# Patient Record
Sex: Female | Born: 1985 | Race: Black or African American | Hispanic: No | Marital: Single | Smoking: Never smoker
Health system: Southern US, Community
[De-identification: ages and names within clinical notes are randomized; demographics above are authoritative.]

## PROBLEM LIST (undated history)

## (undated) ENCOUNTER — Ambulatory Visit (HOSPITAL_COMMUNITY): Admission: EM | Payer: BC Managed Care – PPO | Source: Home / Self Care

## (undated) DIAGNOSIS — J45909 Unspecified asthma, uncomplicated: Secondary | ICD-10-CM

## (undated) DIAGNOSIS — L309 Dermatitis, unspecified: Secondary | ICD-10-CM

## (undated) HISTORY — DX: Dermatitis, unspecified: L30.9

## (undated) HISTORY — PX: CYST EXCISION: SHX5701

## (undated) HISTORY — DX: Unspecified asthma, uncomplicated: J45.909

---

## 2014-02-22 ENCOUNTER — Other Ambulatory Visit: Payer: Self-pay | Admitting: Obstetrics

## 2014-02-22 DIAGNOSIS — N63 Unspecified lump in unspecified breast: Secondary | ICD-10-CM

## 2014-03-10 ENCOUNTER — Other Ambulatory Visit: Payer: Self-pay | Admitting: Obstetrics

## 2014-03-10 ENCOUNTER — Ambulatory Visit
Admission: RE | Admit: 2014-03-10 | Discharge: 2014-03-10 | Disposition: A | Discharge status: Home / Self Care | Payer: BC Managed Care – PPO | Source: Ambulatory Visit | Attending: Obstetrics | Admitting: Obstetrics

## 2014-03-10 ENCOUNTER — Ambulatory Visit: Admission: RE | Admit: 2014-03-10 | Payer: BC Managed Care – PPO | Source: Ambulatory Visit

## 2014-03-10 DIAGNOSIS — N63 Unspecified lump in unspecified breast: Secondary | ICD-10-CM

## 2015-08-30 ENCOUNTER — Ambulatory Visit (INDEPENDENT_AMBULATORY_CARE_PROVIDER_SITE_OTHER): Payer: BC Managed Care – PPO | Admitting: Pediatrics

## 2015-08-30 ENCOUNTER — Encounter: Payer: Self-pay | Admitting: Pediatrics

## 2015-08-30 VITALS — BP 110/72 | HR 76 | Temp 98.6°F | Resp 16 | Ht 67.13 in | Wt 128.7 lb

## 2015-08-30 DIAGNOSIS — J301 Allergic rhinitis due to pollen: Secondary | ICD-10-CM

## 2015-08-30 DIAGNOSIS — J4530 Mild persistent asthma, uncomplicated: Secondary | ICD-10-CM | POA: Diagnosis not present

## 2015-08-30 DIAGNOSIS — H1045 Other chronic allergic conjunctivitis: Secondary | ICD-10-CM

## 2015-08-30 DIAGNOSIS — H1010 Acute atopic conjunctivitis, unspecified eye: Secondary | ICD-10-CM

## 2015-08-30 MED ORDER — FLUTICASONE PROPIONATE 50 MCG/ACT NA SUSP: 2.0000 | Freq: Every day | NASAL | Status: AC

## 2015-08-30 MED ORDER — OLOPATADINE HCL 0.2 % OP SOLN: 1.0000 [drp] | OPHTHALMIC | Status: DC

## 2015-08-30 MED ORDER — TRIAMCINOLONE ACETONIDE 0.1 % EX CREA: 1.0000 "application " | TOPICAL_CREAM | Freq: Two times a day (BID) | CUTANEOUS | Status: DC | PRN

## 2015-08-30 MED ORDER — MONTELUKAST SODIUM 10 MG PO TABS: 10.0000 mg | ORAL_TABLET | Freq: Every day | ORAL | Status: DC

## 2015-08-30 MED ORDER — ALBUTEROL SULFATE HFA 108 (90 BASE) MCG/ACT IN AERS: 2.0000 | INHALATION_SPRAY | RESPIRATORY_TRACT | Status: DC | PRN

## 2015-08-30 NOTE — Patient Instructions (Addendum)
Environmental control of dust mite Zyrtec 10 mg once a day for runny nose or itchy eyes Fluticasone 2 sprays per nostril once a day for stuffy nose Montelukast  10 mg once a day for coughing or wheezing Pro-air 2 puffs every 4 hours if needed for wheezing or coughing spells Pataday 1 drop in each eye 10 minutes before putting on contact lenses Add prednisone 10 mg twice a day for 4 days, 10 mg on the fifth day to bring your allergic symptoms under control  If you are not wearing contact lenses, you may use Zaditor 0.025%-one drop 3 times a day to prevent allergies in the eyes and Opcon-A one drop 3 times a day if needed for itchy eyes  Triamcinolone 0.1% cream twice a day if needed to red itchy areas below the face.

## 2015-08-30 NOTE — Progress Notes (Signed)
[address] [place] [address] Dept: [phone]  New Patient Note  Patient ID: Donna GullyShantae Joseph, female    DOB: May 11, 1985  Age: 30 y.o. MRN: [id number] Date of Office Visit: 08/30/2015 Referring provider: No referring provider defined for this encounter.    Chief Complaint: Allergies and Asthma  HPI Donna Joseph presents for evaluation of a runny nose , stuffy nose and itchy watery eyes for about 15 years. Her symptoms are perennial. She has aggravation of her symptoms on exposure to dust, cigarette smoke, cats, dogs and the springtime of the year. She had an episode of bronchitis one year ago and  developed asthma. She has a history of eczema. She has never had asthmatic symptoms prior to the past year. Sometimes she has shortness of breath with exercise.  Review of Systems  Constitutional: Negative.   HENT:       Nasal congestion for several years  Eyes:       Itchy eyes  Respiratory:       Bronchitis one year ago with subsequent diagnosis of asthma  Cardiovascular: Negative.   Gastrointestinal: Negative.   Genitourinary:       Ovarian cyst removed  Musculoskeletal: Negative.   Skin:       History of eczema  Neurological: Negative.   Endo/Heme/Allergies:       Sneezing from cats and dogs. No diabetes or thyroid disease  Psychiatric/Behavioral: Negative.     Outpatient Encounter Prescriptions as of 08/30/2015  Medication Sig  . albuterol (PROAIR HFA) 108 (90 Base) MCG/ACT inhaler Inhale 2 puffs into the lungs every 4 (four) hours as needed for wheezing or shortness of breath.  . fluticasone (FLONASE) 50 MCG/ACT nasal spray Place 2 sprays into both nostrils daily. For stuffy nose.  . montelukast (SINGULAIR) 10 MG tablet Take 1 tablet (10 mg total) by mouth at bedtime. For coughing and wheezing.  . Olopatadine HCl (PATADAY) 0.2 % SOLN Place 1 drop into both eyes 1 day or 1 dose.  . triamcinolone cream (KENALOG) 0.1 % Apply 1 application topically 2 (two) times  daily as needed. To red itchy areas below the face.   No facility-administered encounter medications on file as of 08/30/2015.     Drug Allergies:  No Known Allergies  Family History: Donna Joseph's family history includes Asthma in her brother; Sinusitis in her mother. There is no history of Allergic rhinitis, Angioedema, Eczema, Immunodeficiency, or Urticaria..  Social and environmental. 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 She does not smoke cigarettes. She is not exposed to cigarette smoking. There are no pets in the home.  Physical Exam: BP 110/72 mmHg  Pulse 76  Temp(Src) 98.6 F (37 C) (Oral)  Resp 16  Ht 5' 7.13" (1.705 m)  Wt 128 lb 12 oz (58.4 kg)  BMI 20.09 kg/m2   Physical Exam  Constitutional: She is oriented to person, place, and time. She appears well-developed and well-nourished.  HENT:  Eyes showed mild erythema of the palpebral conjunctiva. Ears normal. Nose moderate swelling of nasal turbinates with clear nasal discharge. Pharynx normal.  Neck: Neck supple. No thyromegaly present.  Cardiovascular:  S1 and S2 normal no murmurs  Pulmonary/Chest:  Clear to percussion and auscultation  Abdominal: Soft. There is no tenderness (no hepatosplenomegaly).  Lymphadenopathy:    She has no cervical adenopathy.  Neurological: She is alert and oriented to person, place, and time.  Skin:  Clear  Psychiatric: She has a normal mood and affect. Her behavior is  normal. Judgment and thought content normal.  Vitals reviewed.   Diagnostics: FVC 3.33 L FEV1 2.96 L. Predicted FVC 3.53 L predicted FEV1 2.97 L. After albuterol 2 puffs FVC 3.50 L FEV1 3.12 L-the spirometry is in the normal range and there was no significant improvement after albuterol  Allergy skin tests were extremely positive to grass pollens, weeds, tree pollens, dust mite, cat, dog and cockroach.   Assessment Assessment and Plan: 1. Mild persistent asthma, uncomplicated   2. Allergic rhinitis due to  pollen   3. Seasonal allergic conjunctivitis     Meds ordered this encounter  Medications  . fluticasone (FLONASE) 50 MCG/ACT nasal spray    Sig: Place 2 sprays into both nostrils daily. For stuffy nose.    Dispense:  16 g    Refill:  5  . montelukast (SINGULAIR) 10 MG tablet    Sig: Take 1 tablet (10 mg total) by mouth at bedtime. For coughing and wheezing.    Dispense:  30 tablet    Refill:  5  . albuterol (PROAIR HFA) 108 (90 Base) MCG/ACT inhaler    Sig: Inhale 2 puffs into the lungs every 4 (four) hours as needed for wheezing or shortness of breath.    Dispense:  1 Inhaler    Refill:  3  . Olopatadine HCl (PATADAY) 0.2 % SOLN    Sig: Place 1 drop into both eyes 1 day or 1 dose.    Dispense:  1 Bottle    Refill:  5  . triamcinolone cream (KENALOG) 0.1 %    Sig: Apply 1 application topically 2 (two) times daily as needed. To red itchy areas below the face.    Dispense:  45 g    Refill:  3    Patient Instructions  Environmental control of dust mite Zyrtec 10 mg once a day for runny nose or itchy eyes Fluticasone 2 sprays per nostril once a day for stuffy nose Montelukast  10 mg once a day for coughing or wheezing Pro-air 2 puffs every 4 hours if needed for wheezing or coughing spells Pataday 1 drop in each eye 10 minutes before putting on contact lenses Add prednisone 10 mg twice a day for 4 days, 10 mg on the fifth day to bring your allergic symptoms under control  If you are not wearing contact lenses, you may use Zaditor 0.025%-one drop 3 times a day to prevent allergies in the eyes and Opcon-A one drop 3 times a day if needed for itchy eyes  Triamcinolone 0.1% cream twice a day if needed to red itchy areas below the face.    Return in about 4 weeks (around 09/27/2015).   Thank you for the opportunity to care for this patient.  Please do not hesitate to contact me with questions.  Tonette Bihari, M.D.  [facility] [address] [place], [place] [address] [phone]

## 2015-09-22 ENCOUNTER — Ambulatory Visit: Payer: BC Managed Care – PPO | Admitting: Pediatrics

## 2015-10-11 ENCOUNTER — Encounter: Payer: Self-pay | Admitting: Pediatrics

## 2015-10-11 ENCOUNTER — Ambulatory Visit (INDEPENDENT_AMBULATORY_CARE_PROVIDER_SITE_OTHER): Payer: BC Managed Care – PPO | Admitting: Pediatrics

## 2015-10-11 VITALS — BP 118/62 | HR 88 | Temp 99.1°F | Resp 16

## 2015-10-11 DIAGNOSIS — J301 Allergic rhinitis due to pollen: Secondary | ICD-10-CM

## 2015-10-11 DIAGNOSIS — H1045 Other chronic allergic conjunctivitis: Secondary | ICD-10-CM

## 2015-10-11 DIAGNOSIS — J4530 Mild persistent asthma, uncomplicated: Secondary | ICD-10-CM

## 2015-10-11 DIAGNOSIS — H1010 Acute atopic conjunctivitis, unspecified eye: Secondary | ICD-10-CM

## 2015-10-11 MED ORDER — AZELASTINE HCL 0.1 % NA SOLN: 2.0000 | Freq: Two times a day (BID) | NASAL | Status: AC

## 2015-10-11 NOTE — Progress Notes (Signed)
  [address] [place] [address] Dept: [phone]  FOLLOW UP NOTE  Patient ID: Donna GullyShantae Joseph, female    DOB: Jan 30, 1986  Age: 30 y.o. MRN: [id number] Date of Office Visit: 10/11/2015  Assessment Chief Complaint: Allergies and Asthma  HPI Donna Joseph presents for follow-up of asthma and allergic rhinitis. Her asthmatic symptoms are well controlled. She is having at times nasal congestion and sometimes she has some itching in the back of her throat but not all the time. She is a very allergic individual.  Current medications are cetirizine 10 mg once a day, fluticasone 2 sprays per nostril once a day, montelukast 10 mg once a day and Pataday 1 drop once a day 10 minutes before using her contact lenses, Pro-air 2 puffs every 4 hours if needed and triamcinolone 0.1% cream twice a day if needed to red itchy areas below the face   Drug Allergies:  No Known Allergies  Physical Exam: BP 118/62 mmHg  Pulse 88  Temp(Src) 99.1 F (37.3 C) (Oral)  Resp 16   Physical Exam  Constitutional: She is oriented to person, place, and time. She appears well-developed and well-nourished.  HENT:  Eyes normal. Ears normal. Nose mild swelling of nasal turbinates. Pharynx normal.  Neck: Neck supple. No thyromegaly present.  Cardiovascular:  S1 and S2 normal no murmurs  Pulmonary/Chest:  Clear to percussion and auscultation  Lymphadenopathy:    She has no cervical adenopathy.  Neurological: She is alert and oriented to person, place, and time.  Psychiatric: She has a normal mood and affect. Her behavior is normal. Judgment and thought content normal.  Vitals reviewed.   Diagnostics:  FVC 3.25 L FEV1 2.80 L. Predicted FVC 3.53 L predicted FEV1 2.97 L-the spirometry is in the normal range  Assessment and Plan: 1. Mild persistent asthma, uncomplicated   2. Allergic rhinitis due to pollen   3. Seasonal allergic conjunctivitis     Meds ordered this encounter  Medications  .  azelastine (ASTELIN) 0.1 % nasal spray    Sig: Place 2 sprays into both nostrils 2 (two) times daily.    Dispense:  30 mL    Refill:  5    Patient Instructions  Continue on your current medications If you have an itchy throat add  azelastine 0.1%-2 sprays per nostril twice a day Call me if you're not doing well on this treatment plan You are a  very allergic individual area Let me know if you want to start allergy injections    Return if symptoms worsen or fail to improve.    Thank you for the opportunity to care for this patient.  Please do not hesitate to contact me with questions.  Tonette BihariJ. A. Bardelas, M.D.  [facility] [address] [place] [address] [phone]

## 2015-10-11 NOTE — Patient Instructions (Addendum)
Continue on your current medications If you have an itchy throat add  azelastine 0.1%-2 sprays per nostril twice a day Call me if you're not doing well on this treatment plan You are a  very allergic individual area Let me know if you want to start allergy injections

## 2020-03-04 ENCOUNTER — Other Ambulatory Visit: Payer: Self-pay | Admitting: Obstetrics

## 2020-03-04 DIAGNOSIS — N6320 Unspecified lump in the left breast, unspecified quadrant: Secondary | ICD-10-CM

## 2020-03-04 DIAGNOSIS — N6310 Unspecified lump in the right breast, unspecified quadrant: Secondary | ICD-10-CM

## 2020-04-12 ENCOUNTER — Other Ambulatory Visit: Payer: BC Managed Care – PPO

## 2020-05-18 ENCOUNTER — Ambulatory Visit
Admission: RE | Admit: 2020-05-18 | Discharge: 2020-05-18 | Disposition: A | Discharge status: Home / Self Care | Payer: Medicaid Other | Source: Ambulatory Visit | Attending: Obstetrics | Admitting: Obstetrics

## 2020-05-18 ENCOUNTER — Other Ambulatory Visit: Payer: Self-pay

## 2020-05-18 DIAGNOSIS — N6310 Unspecified lump in the right breast, unspecified quadrant: Secondary | ICD-10-CM

## 2020-05-18 DIAGNOSIS — N6320 Unspecified lump in the left breast, unspecified quadrant: Secondary | ICD-10-CM

## 2021-08-13 IMAGING — US US BREAST*R* LIMITED INC AXILLA
1 series · 6 of 6 positions shown · non-contrast
Comparison: Previous exam(s).

CLINICAL DATA: Patient presents with bilateral palpable breast
lumps.



[Series 1: us breast*right* limited inc axilla · 0.07mm/px · 6 of 6 slices shown]
[im 1/6]
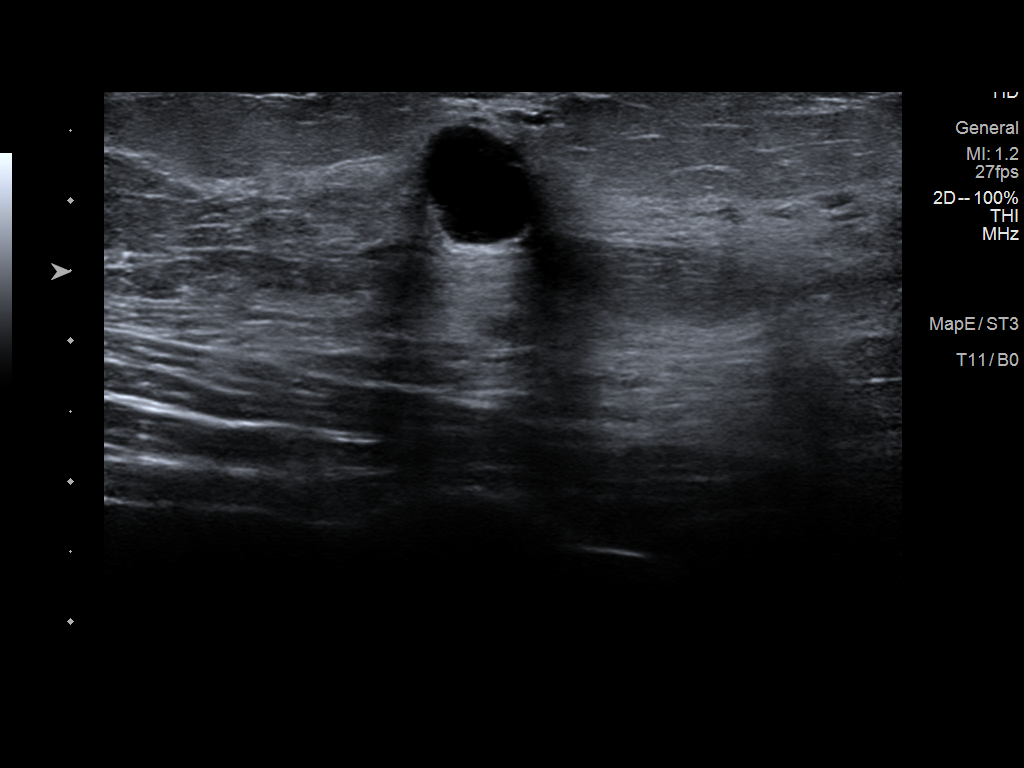
[im 2/6]
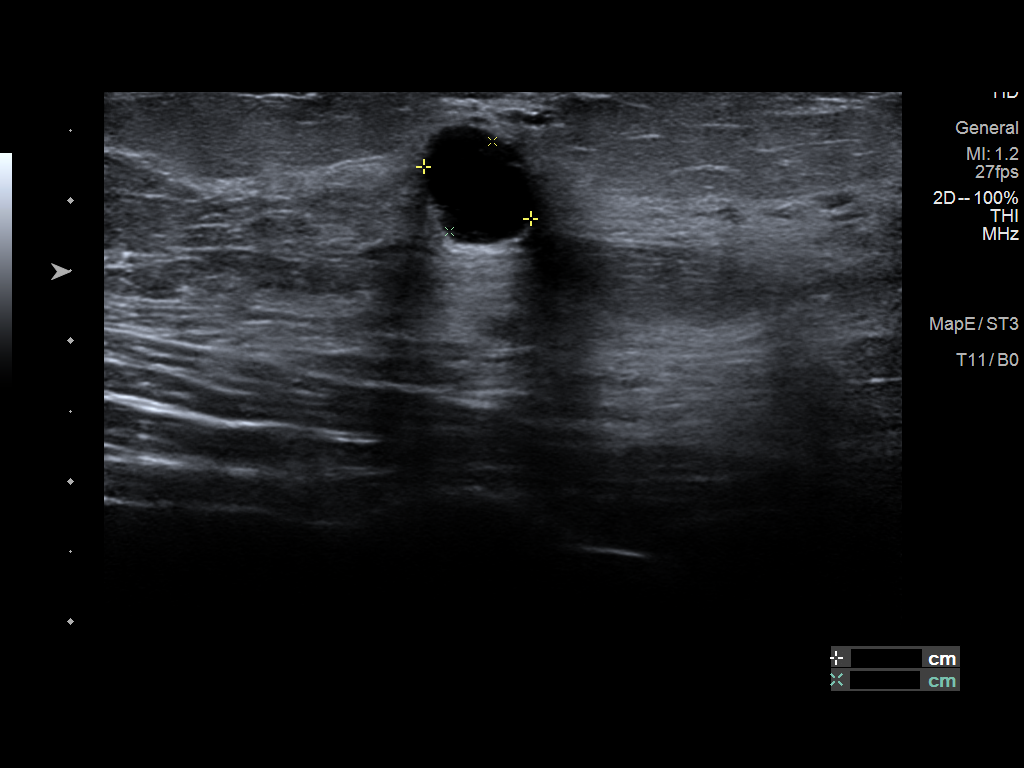
[im 3/6]
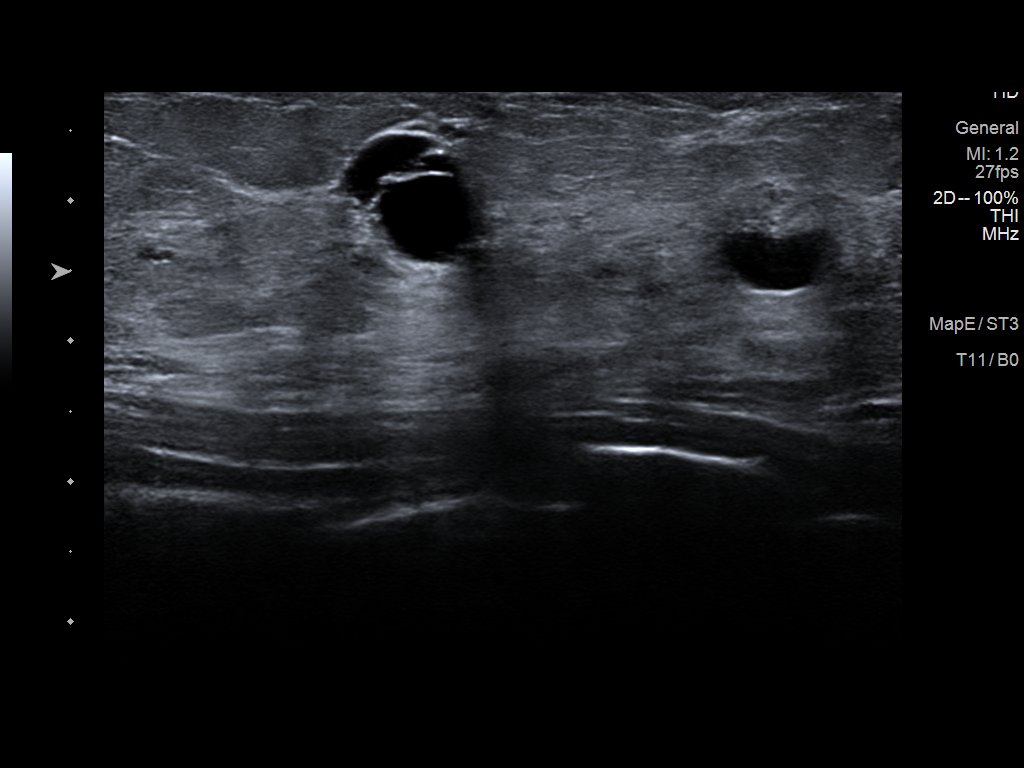
[im 4/6]
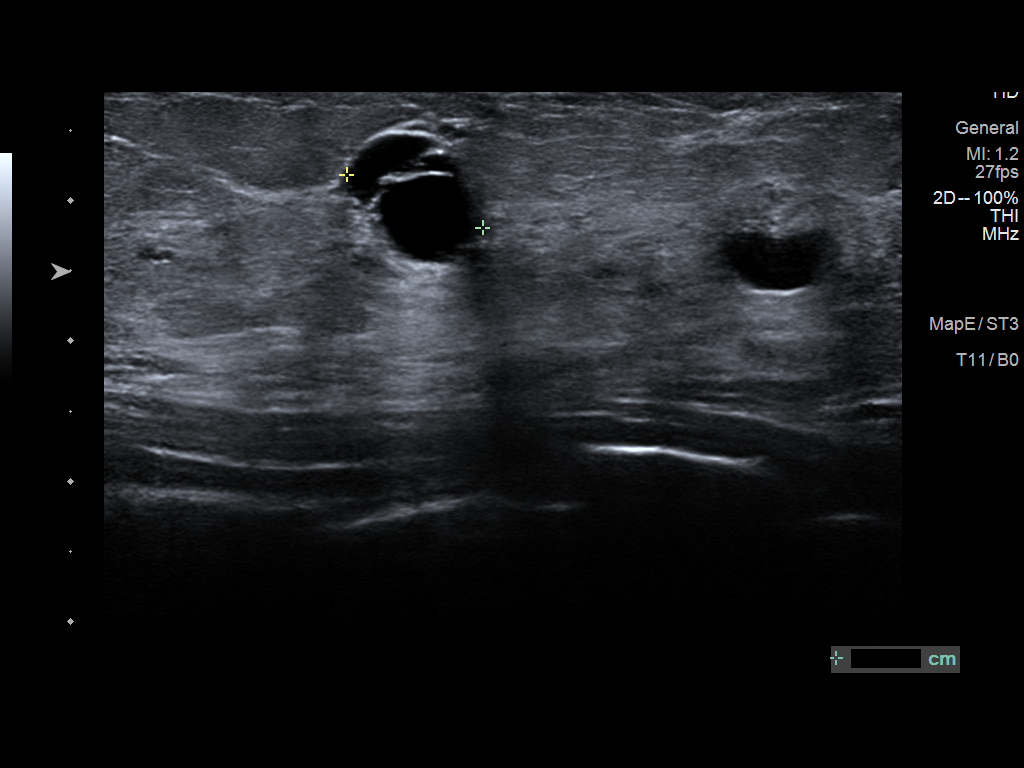
[im 5/6]
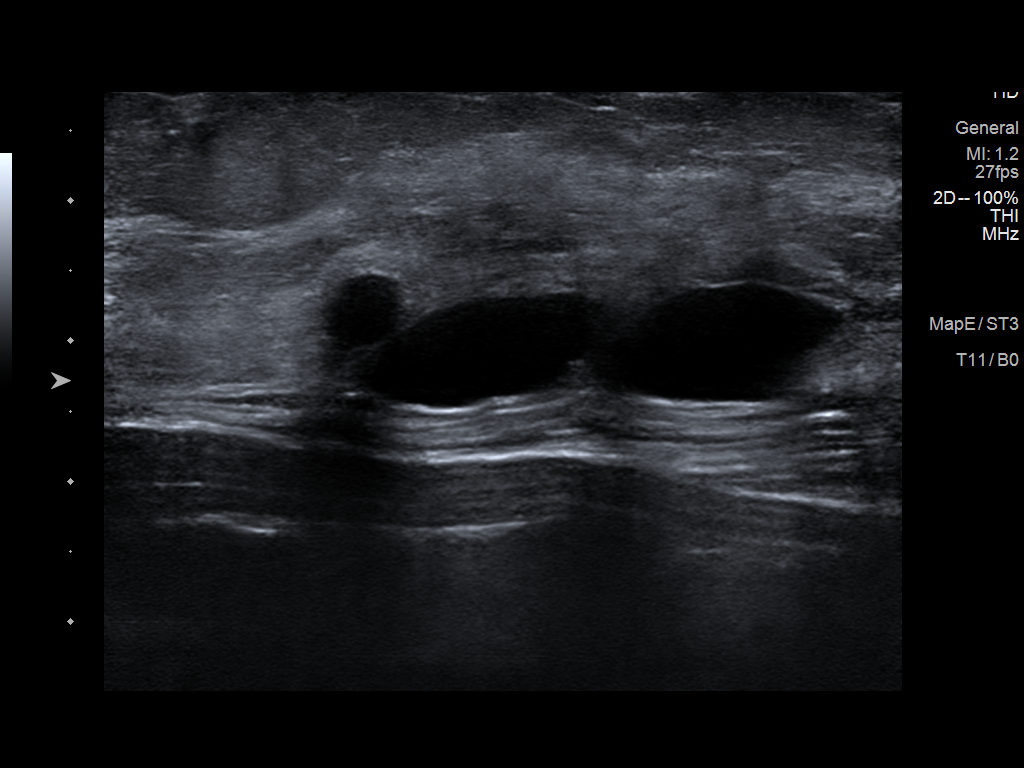
[im 6/6]
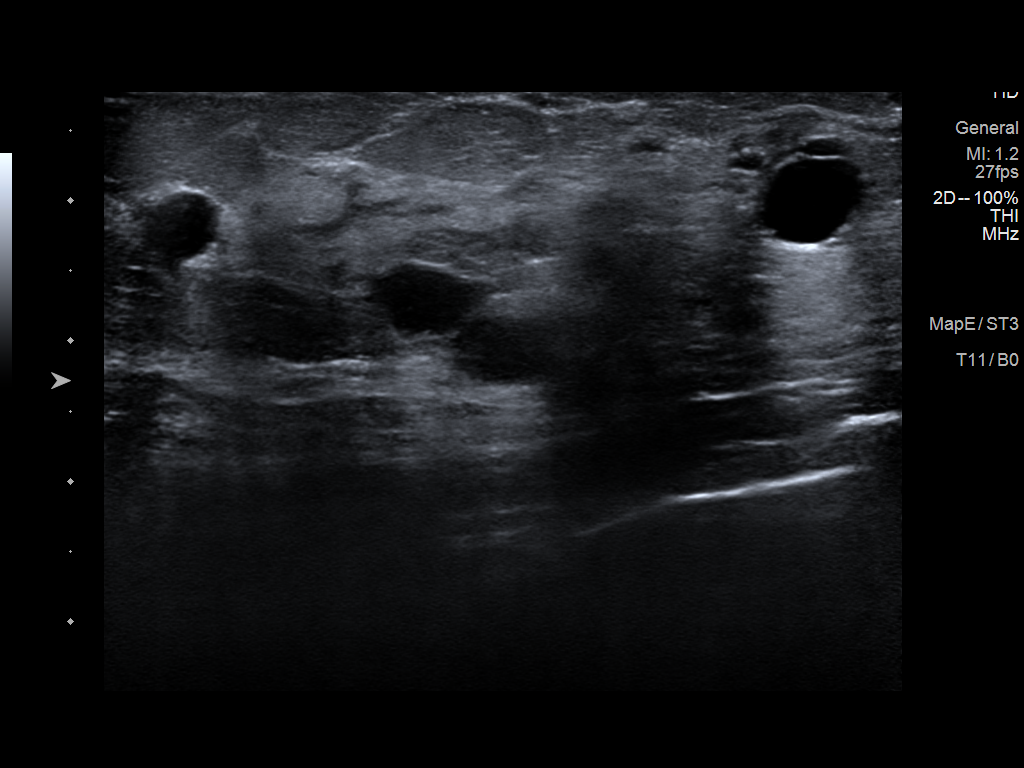

[6 of 6 positions shown; findings below may reference images not displayed]

ACR Breast Density Category b: There are scattered areas of
fibroglandular density.
FINDINGS: There are multiple bilateral circumscribed round and oval masses. On
the left, there are 2 new masses may correspond to the palpable
abnormalities. The right, there are group of smaller masses neck
correspond to the palpable abnormalities, several smaller than on
the prior mammograms.

There are no areas of architectural distortion and there are no
suspicious calcifications.

Targeted right breast ultrasound is performed, showing several
simple cysts at 10 o'clock, 7 cm the nipple, corresponding to the
palpable abnormalities. The largest measures 1 cm in long axis.
There are no solid masses or suspicious lesions.

Targeted left breast ultrasound is performed, showing a simple cyst
at 11 o'clock, 2 cm the nipple, measuring 1.8 x 1.3 x 1.8 cm. There
is another simple cyst at 2:30 o'clock, 4 cm the nipple, measuring
1.7 x 1.5 x 1.6 cm. These 2 cysts correspond to the palpable
abnormalities. There are no solid masses or suspicious lesions.
IMPRESSION: 1. No evidence of breast malignancy.
2. Multiple bilateral breast cysts.

RECOMMENDATION:
Screening mammogram at age 38 unless there are persistent or
intervening clinical concerns. (Code:UU-5-XGR)

I have discussed the findings and recommendations with the patient.
If applicable, a reminder letter will be sent to the patient
regarding the next appointment.

BI-RADS CATEGORY  2: Benign.

## 2022-01-04 ENCOUNTER — Encounter (HOSPITAL_COMMUNITY): Payer: Self-pay | Admitting: Emergency Medicine

## 2022-01-04 ENCOUNTER — Ambulatory Visit (HOSPITAL_COMMUNITY)
Admission: EM | Admit: 2022-01-04 | Discharge: 2022-01-04 | Disposition: A | Discharge status: Home / Self Care | Payer: BC Managed Care – PPO | Attending: Family Medicine | Admitting: Family Medicine

## 2022-01-04 DIAGNOSIS — T7840XA Allergy, unspecified, initial encounter: Secondary | ICD-10-CM | POA: Diagnosis not present

## 2022-01-04 MED ORDER — ALBUTEROL SULFATE HFA 108 (90 BASE) MCG/ACT IN AERS: 2.0000 | INHALATION_SPRAY | RESPIRATORY_TRACT | 0 refills | Status: AC | PRN

## 2022-01-04 MED ORDER — ALBUTEROL SULFATE (2.5 MG/3ML) 0.083% IN NEBU
INHALATION_SOLUTION | RESPIRATORY_TRACT | Status: AC
  Filled 2022-01-04: qty 3

## 2022-01-04 MED ORDER — ALBUTEROL SULFATE (2.5 MG/3ML) 0.083% IN NEBU
2.5000 mg | INHALATION_SOLUTION | Freq: Once | RESPIRATORY_TRACT | Status: AC
  Administered 2022-01-04: 2.5 mg via RESPIRATORY_TRACT

## 2022-01-04 MED ORDER — TRIAMCINOLONE ACETONIDE 40 MG/ML IJ SUSP
INTRAMUSCULAR | Status: AC
  Filled 2022-01-04: qty 1

## 2022-01-04 MED ORDER — TRIAMCINOLONE ACETONIDE 40 MG/ML IJ SUSP: 40.0000 mg | Freq: Once | INTRAMUSCULAR | Status: DC

## 2022-01-04 NOTE — Discharge Instructions (Addendum)
You are given a breathing treatment of albuterol here in the office. You were then given a shot of triamcinolone 40 mg  Albuterol inhaler--do 2 puffs every 4 hours as needed for shortness of breath or wheezing  You can take Zyrtec 10 mg, 1 daily or Benadryl 25 mg 1 every 6 hours as needed for itching or allergic reaction.  The Benadryl is more likely to make you sleepy

## 2022-01-04 NOTE — ED Triage Notes (Signed)
Pt reports bit by something on left foot this morning around 730a. Reports started having itchy throat, coughing and wheezing. Took Benadryl 50 mg about 30-45 minutes ago. Reports starting to feel little better now, but would like wheezing checked out.  Pain at bit spot. Tried icing it earlier.

## 2022-01-04 NOTE — ED Provider Notes (Signed)
[place]    CSN: [id number] Arrival date & time: 01/04/22  0915      History   Chief Complaint Chief Complaint  Patient presents with   Insect Bite   Allergic Reaction    HPI Donna Joseph is a 36 y.o. female.    Allergic Reaction  Here for wheezing and shortness of breath that began this morning  Earlier this morning she felt something Bidor like maybe an ant in her sock.  Right after that she began noting some wheezing and trouble breathing.  Her inhaler is expired, but she does have a history of asthma.  She took a Benadryl and is maybe helped some.  No syncope, and no feeling of tingling or swelling of her lips or tongue. Past Medical History:  Diagnosis Date   Asthma    Eczema     Patient Active Problem List   Diagnosis Date Noted   Seasonal allergic conjunctivitis 08/30/2015   Allergic rhinitis due to pollen 08/30/2015   Mild persistent asthma 08/30/2015    Past Surgical History:  Procedure Laterality Date   CYST EXCISION Right    abdomina    OB History   No obstetric history on file.      Home Medications    Prior to Admission medications   Medication Sig Start Date End Date Taking? Authorizing Provider  loratadine (CLARITIN) 10 MG tablet Take 10 mg by mouth daily.   Yes [provider]  albuterol (PROAIR HFA) 108 (90 Base) MCG/ACT inhaler Inhale 2 puffs into the lungs every 4 (four) hours as needed for wheezing or shortness of breath. 01/04/22   Barrett Henle, MD  azelastine (ASTELIN) 0.1 % nasal spray Place 2 sprays into both nostrils 2 (two) times daily. 10/11/15   Charlies Silvers, MD  cetirizine (ZYRTEC) 10 MG chewable tablet Chew 10 mg by mouth daily.    [provider]  fluticasone (FLONASE) 50 MCG/ACT nasal spray Place 2 sprays into both nostrils daily. For stuffy nose. 08/30/15   Charlies Silvers, MD    Family History Family History  Problem Relation Age of Onset   Sinusitis Mother    Asthma  Brother    Allergic rhinitis Neg Hx    Angioedema Neg Hx    Eczema Neg Hx    Immunodeficiency Neg Hx    Urticaria Neg Hx     Social History Social History   Tobacco Use   Smoking status: Never  Substance Use Topics   Alcohol use: Yes   Drug use: No     Allergies   Patient has no known allergies.   Review of Systems Review of Systems   Physical Exam Triage Vital Signs ED Triage Vitals  Enc Vitals Group     BP 01/04/22 0926 120/74     Pulse Rate 01/04/22 0926 71     Resp 01/04/22 0926 19     Temp 01/04/22 0926 98.9 F (37.2 C)     Temp Source 01/04/22 0926 Oral     SpO2 01/04/22 0926 100 %     Weight --      Height --      Head Circumference --      Peak Flow --      Pain Score 01/04/22 0923 4     Pain Loc --      Pain Edu? --      Excl. in [place]? --    No data found.  Updated Vital Signs  BP 120/74 (BP Location: Right Arm)   Pulse 71   Temp 98.9 F (37.2 C) (Oral)   Resp 19   LMP 12/20/2021   SpO2 100%   Visual Acuity Right Eye Distance:   Left Eye Distance:   Bilateral Distance:    Right Eye Near:   Left Eye Near:    Bilateral Near:     Physical Exam Vitals reviewed.  Constitutional:      General: She is not in acute distress.    Appearance: She is not ill-appearing, toxic-appearing or diaphoretic.  HENT:     Mouth/Throat:     Mouth: Mucous membranes are moist.     Pharynx: No oropharyngeal exudate or posterior oropharyngeal erythema.     Comments: Tongue and lips are normal and not swollen Eyes:     Extraocular Movements: Extraocular movements intact.     Conjunctiva/sclera: Conjunctivae normal.     Pupils: Pupils are equal, round, and reactive to light.  Cardiovascular:     Rate and Rhythm: Normal rate and regular rhythm.     Heart sounds: No murmur heard. Pulmonary:     Comments: There is no stridor.  Breath sounds are coarse and air movement is fairly good. Musculoskeletal:     Cervical back: Neck supple.  Lymphadenopathy:      Cervical: No cervical adenopathy.  Skin:    Coloration: Skin is not pale.  Neurological:     General: No focal deficit present.     Mental Status: She is oriented to person, place, and time.  Psychiatric:        Behavior: Behavior normal.      UC Treatments / Results  Labs (all labs ordered are listed, but only abnormal results are displayed) Labs Reviewed - No data to display  EKG   Radiology No results found.  Procedures Procedures (including critical care time)  Medications Ordered in UC Medications  triamcinolone acetonide (KENALOG-40) injection 40 mg (has no administration in time range)  albuterol (PROVENTIL) (2.5 MG/3ML) 0.083% nebulizer solution 2.5 mg (2.5 mg Nebulization Given 01/04/22 0934)    Initial Impression / Assessment and Plan / UC Course  I have reviewed the triage vital signs and the nursing notes.  Pertinent labs & imaging results that were available during my care of the patient were reviewed by me and considered in my medical decision making (see chart for details).        She is given an albuterol treatment.  She had resolution of her wheezing and shortness of breath with the treatment.  She is given an injection of steroids here and then she will use her albuterol inhaler and antihistamines as needed at home. Final Clinical Impressions(s) / UC Diagnoses   Final diagnoses:  Allergic reaction, initial encounter     Discharge Instructions      You are given a breathing treatment of albuterol here in the office. You were then given a shot of triamcinolone 40 mg  Albuterol inhaler--do 2 puffs every 4 hours as needed for shortness of breath or wheezing  You can take Zyrtec 10 mg, 1 daily or Benadryl 25 mg 1 every 6 hours as needed for itching or allergic reaction.  The Benadryl is more likely to make you sleepy     ED Prescriptions     Medication Sig Dispense Auth. Provider   albuterol (PROAIR HFA) 108 (90 Base) MCG/ACT inhaler  Inhale 2 puffs into the lungs every 4 (four) hours as needed for wheezing or shortness  of breath. 1 each Barrett Henle, MD      PDMP not reviewed this encounter.   Barrett Henle, MD 01/04/22 1003
